# Patient Record
Sex: Male | Born: 1985 | Race: White | Hispanic: No | Marital: Single | Smoking: Current every day smoker
Health system: Southern US, Community
[De-identification: ages and names within clinical notes are randomized; demographics above are authoritative.]

---
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
## 2009-02-18 ENCOUNTER — Emergency Department (HOSPITAL_COMMUNITY): Admission: EM | Admit: 2009-02-18 | Discharge: 2009-02-18 | Payer: Self-pay | Admitting: Family Medicine

## 2009-06-08 ENCOUNTER — Emergency Department (HOSPITAL_COMMUNITY): Admission: EM | Admit: 2009-06-08 | Discharge: 2009-06-08 | Payer: Self-pay | Admitting: Emergency Medicine

## 2009-09-04 ENCOUNTER — Emergency Department (HOSPITAL_COMMUNITY): Admission: EM | Admit: 2009-09-04 | Discharge: 2009-09-04 | Payer: Self-pay | Admitting: Emergency Medicine

## 2009-09-21 ENCOUNTER — Emergency Department (HOSPITAL_COMMUNITY): Admission: EM | Admit: 2009-09-21 | Discharge: 2009-09-21 | Payer: Self-pay | Admitting: Family Medicine

## 2011-05-25 ENCOUNTER — Encounter (HOSPITAL_COMMUNITY): Payer: Self-pay | Admitting: Radiology

## 2011-05-25 ENCOUNTER — Inpatient Hospital Stay (HOSPITAL_COMMUNITY)
Admission: EM | Admit: 2011-05-25 | Discharge: 2011-05-26 | DRG: 074 | Disposition: A | Discharge status: Home / Self Care | Payer: 59 | Attending: General Surgery | Admitting: General Surgery

## 2011-05-25 ENCOUNTER — Emergency Department (HOSPITAL_COMMUNITY): Payer: 59

## 2011-05-25 DIAGNOSIS — S2249XA Multiple fractures of ribs, unspecified side, initial encounter for closed fracture: Secondary | ICD-10-CM

## 2011-05-25 DIAGNOSIS — IMO0002 Reserved for concepts with insufficient information to code with codable children: Secondary | ICD-10-CM | POA: Diagnosis present

## 2011-05-25 DIAGNOSIS — J9383 Other pneumothorax: Secondary | ICD-10-CM | POA: Diagnosis present

## 2011-05-25 DIAGNOSIS — F1910 Other psychoactive substance abuse, uncomplicated: Secondary | ICD-10-CM | POA: Diagnosis present

## 2011-05-25 DIAGNOSIS — G5690 Unspecified mononeuropathy of unspecified upper limb: Principal | ICD-10-CM | POA: Diagnosis present

## 2011-05-25 LAB — COMPREHENSIVE METABOLIC PANEL
ALT: 21 U/L (ref 0–53)
Albumin: 4.3 g/dL (ref 3.5–5.2)
CO2: 25 mEq/L (ref 19–32)
Creatinine, Ser: 0.75 mg/dL (ref 0.50–1.35)
GFR calc Af Amer: >60 mL/min (ref >60)
Potassium: 3.9 mEq/L (ref 3.5–5.1)
Sodium: 136 mEq/L (ref 135–145)
Total Protein: 7.1 g/dL (ref 6.0–8.3)

## 2011-05-25 LAB — DIFFERENTIAL
Basophils Absolute: 0 10*3/uL (ref 0.0–0.1)
Eosinophils Absolute: 0.1 10*3/uL (ref 0.0–0.7)
Neutro Abs: 14.1 10*3/uL — ABNORMAL HIGH (ref 1.7–7.7)
Neutrophils Relative %: 82 % — ABNORMAL HIGH (ref 43–77)

## 2011-05-25 LAB — CBC
Hemoglobin: 14.9 g/dL (ref 13.0–17.0)
MCH: 31.6 pg (ref 26.0–34.0)
MCHC: 36.1 g/dL — ABNORMAL HIGH (ref 30.0–36.0)
RBC: 4.72 MIL/uL (ref 4.22–5.81)
WBC: 17.1 10*3/uL — ABNORMAL HIGH (ref 4.0–10.5)

## 2011-05-25 LAB — URINALYSIS, ROUTINE W REFLEX MICROSCOPIC
Bilirubin Urine: NEGATIVE
Hgb urine dipstick: NEGATIVE
Leukocytes, UA: NEGATIVE
Nitrite: NEGATIVE
Protein, ur: NEGATIVE mg/dL

## 2011-05-25 MED ORDER — IOHEXOL 300 MG/ML  SOLN
80.0000 mL | Freq: Once | INTRAMUSCULAR | Status: AC | PRN
  Administered 2011-05-25: 80 mL via INTRAVENOUS

## 2011-05-26 ENCOUNTER — Inpatient Hospital Stay (HOSPITAL_COMMUNITY): Payer: 59
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
## 2011-06-01 ENCOUNTER — Other Ambulatory Visit (INDEPENDENT_AMBULATORY_CARE_PROVIDER_SITE_OTHER): Payer: Self-pay | Admitting: General Surgery

## 2011-06-01 MED ORDER — HYDROCODONE-ACETAMINOPHEN 10-325 MG PO TABS: 1.0000 | ORAL_TABLET | Freq: Four times a day (QID) | ORAL | Status: DC | PRN

## 2011-06-20 ENCOUNTER — Other Ambulatory Visit (INDEPENDENT_AMBULATORY_CARE_PROVIDER_SITE_OTHER): Payer: Self-pay | Admitting: Orthopedic Surgery

## 2011-06-20 MED ORDER — HYDROCODONE-ACETAMINOPHEN 10-325 MG PO TABS: 1.0000 | ORAL_TABLET | Freq: Four times a day (QID) | ORAL | Status: AC | PRN

## 2011-06-29 ENCOUNTER — Telehealth (INDEPENDENT_AMBULATORY_CARE_PROVIDER_SITE_OTHER): Payer: Self-pay | Admitting: Orthopedic Surgery
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
## 2013-05-12 ENCOUNTER — Emergency Department: Payer: Self-pay | Admitting: Emergency Medicine

## 2013-05-12 LAB — CBC WITH DIFFERENTIAL/PLATELET
Bands: 3 %
Metamyelocyte: 1 %
Platelet: 229 10*3/uL (ref 150–440)
RBC: 4.64 10*6/uL (ref 4.40–5.90)
Segmented Neutrophils: 39 %

## 2013-05-12 LAB — BASIC METABOLIC PANEL
Anion Gap: 9 (ref 7–16)
Creatinine: 0.97 mg/dL (ref 0.60–1.30)
Osmolality: 271 (ref 275–301)
Potassium: 3.4 mmol/L — ABNORMAL LOW (ref 3.5–5.1)

## 2014-06-27 ENCOUNTER — Encounter (HOSPITAL_COMMUNITY): Payer: Self-pay | Admitting: Emergency Medicine

## 2014-06-27 ENCOUNTER — Emergency Department (HOSPITAL_COMMUNITY)
Admission: EM | Admit: 2014-06-27 | Discharge: 2014-06-27 | Disposition: A | Discharge status: Home / Self Care | Payer: Self-pay | Source: Home / Self Care | Attending: Family Medicine | Admitting: Family Medicine

## 2014-06-27 DIAGNOSIS — H65191 Other acute nonsuppurative otitis media, right ear: Secondary | ICD-10-CM

## 2014-06-27 DIAGNOSIS — H65199 Other acute nonsuppurative otitis media, unspecified ear: Secondary | ICD-10-CM

## 2014-06-27 LAB — POCT URINALYSIS DIP (DEVICE)
Bilirubin Urine: NEGATIVE
Glucose, UA: NEGATIVE mg/dL
HGB URINE DIPSTICK: NEGATIVE
Ketones, ur: NEGATIVE mg/dL
LEUKOCYTES UA: NEGATIVE
NITRITE: NEGATIVE
Protein, ur: NEGATIVE mg/dL
Specific Gravity, Urine: 1.01 (ref 1.005–1.030)
Urobilinogen, UA: 0.2 mg/dL (ref 0.0–1.0)
pH: 6 (ref 5.0–8.0)

## 2014-06-27 MED ORDER — AMOXICILLIN 500 MG PO CAPS: 500.0000 mg | ORAL_CAPSULE | Freq: Three times a day (TID) | ORAL | Status: DC

## 2014-06-27 MED ORDER — CIPROFLOXACIN-HYDROCORTISONE 0.2-1 % OT SUSP: 4.0000 [drp] | Freq: Three times a day (TID) | OTIC | Status: DC

## 2014-06-27 NOTE — ED Notes (Signed)
Patient c/o right ear pain onset yesterday with dizziness. Patient reports he took a shower and felt funny afterwards. Also c/o of left testicular pain and rectal bleeding.  Patient is alert and oriented and in no acute distress.

## 2014-06-27 NOTE — Discharge Instructions (Signed)
Take all of medicine, see your doctor if further problems. °

## 2014-06-27 NOTE — ED Provider Notes (Addendum)
CSN: [id number]     Arrival date & time 06/27/14  0957 History   First MD Initiated Contact with Patient 06/27/14 1022     Chief Complaint  Patient presents with  . Otalgia  . Testicle Pain   (Consider location/radiation/quality/duration/timing/severity/associated sxs/prior Treatment) Patient is a 28 y.o. male presenting with ear pain. The history is provided by the patient.  Otalgia Location:  Right Behind ear:  No abnormality Quality:  Sore and throbbing Severity:  Mild Onset quality:  Gradual Duration:  2 days Progression:  Worsening Chronicity:  New Relieved by:  None tried Worsened by:  Nothing tried Ineffective treatments:  None tried Associated symptoms: congestion   Associated symptoms: no ear discharge, no fever, no rhinorrhea and no sore throat     History reviewed. No pertinent past medical history. History reviewed. No pertinent past surgical history. No family history on file. History  Substance Use Topics  . Smoking status: Current Every Day Smoker    Types: Cigarettes  . Smokeless tobacco: Not on file  . Alcohol Use: Yes    Review of Systems  Constitutional: Negative.  Negative for fever.  HENT: Positive for congestion and ear pain. Negative for ear discharge, postnasal drip, rhinorrhea and sore throat.   Eyes: Negative.     Allergies  Review of patient's allergies indicates no known allergies.  Home Medications   Prior to Admission medications   Medication Sig Start Date End Date Taking? Authorizing Provider  amoxicillin (AMOXIL) 500 MG capsule Take 1 capsule (500 mg total) by mouth 3 (three) times daily. 06/27/14   Linna HoffJames D Edy Mcbane, MD  ciprofloxacin-hydrocortisone ([facility]) otic suspension Place 4 drops into the right ear 3 (three) times daily. 06/27/14   Linna HoffJames D Deziree Mokry, MD   BP 127/71  Pulse 58  Temp(Src) 99.1 F (37.3 C) (Oral)  Resp 18  SpO2 100% Physical Exam  Nursing note and vitals reviewed. Constitutional: He is oriented to person, place,  and time. He appears well-developed and well-nourished. No distress.  HENT:  Head: Normocephalic.  Right Ear: Tympanic membrane is injected, erythematous and retracted. Tympanic membrane mobility is abnormal. There is hemotympanum.  Left Ear: Tympanic membrane, external ear and ear canal normal.  Nose: Nose normal.  Mouth/Throat: Oropharynx is clear and moist.  Eyes: Pupils are equal, round, and reactive to light.  Neck: Normal range of motion. Neck supple.  Lymphadenopathy:    He has no cervical adenopathy.  Neurological: He is alert and oriented to person, place, and time.  Skin: Skin is warm and dry.    ED Course  Procedures (including critical care time) Labs Review Labs Reviewed  POCT URINALYSIS DIP (DEVICE)    Imaging Review No results found.   MDM   1. Acute nonsuppurative otitis media of right ear        Linna HoffJames D Solstice Lastinger, MD 06/27/14 1042  Linna HoffJames D Areej Tayler, MD 06/27/14 1235

## 2016-07-22 ENCOUNTER — Other Ambulatory Visit: Payer: Self-pay | Admitting: Physician Assistant

## 2016-07-22 ENCOUNTER — Ambulatory Visit
Admission: RE | Admit: 2016-07-22 | Discharge: 2016-07-22 | Disposition: A | Discharge status: Home / Self Care | Payer: No Typology Code available for payment source | Source: Ambulatory Visit | Attending: Physician Assistant | Admitting: Physician Assistant

## 2016-07-22 DIAGNOSIS — R52 Pain, unspecified: Secondary | ICD-10-CM

## 2017-11-11 IMAGING — CR DG SHOULDER 2+V*R*
3 series · 3 of 3 positions shown · non-contrast
Comparison: Right shoulder series of May 25, 2011

CLINICAL DATA: Right shoulder pain for the past 2 months made worse
with lifting or other physical activity. No known injury.

EXAM:
RIGHT SHOULDER - 2+ VIEW

[w shoulder ap external righ]
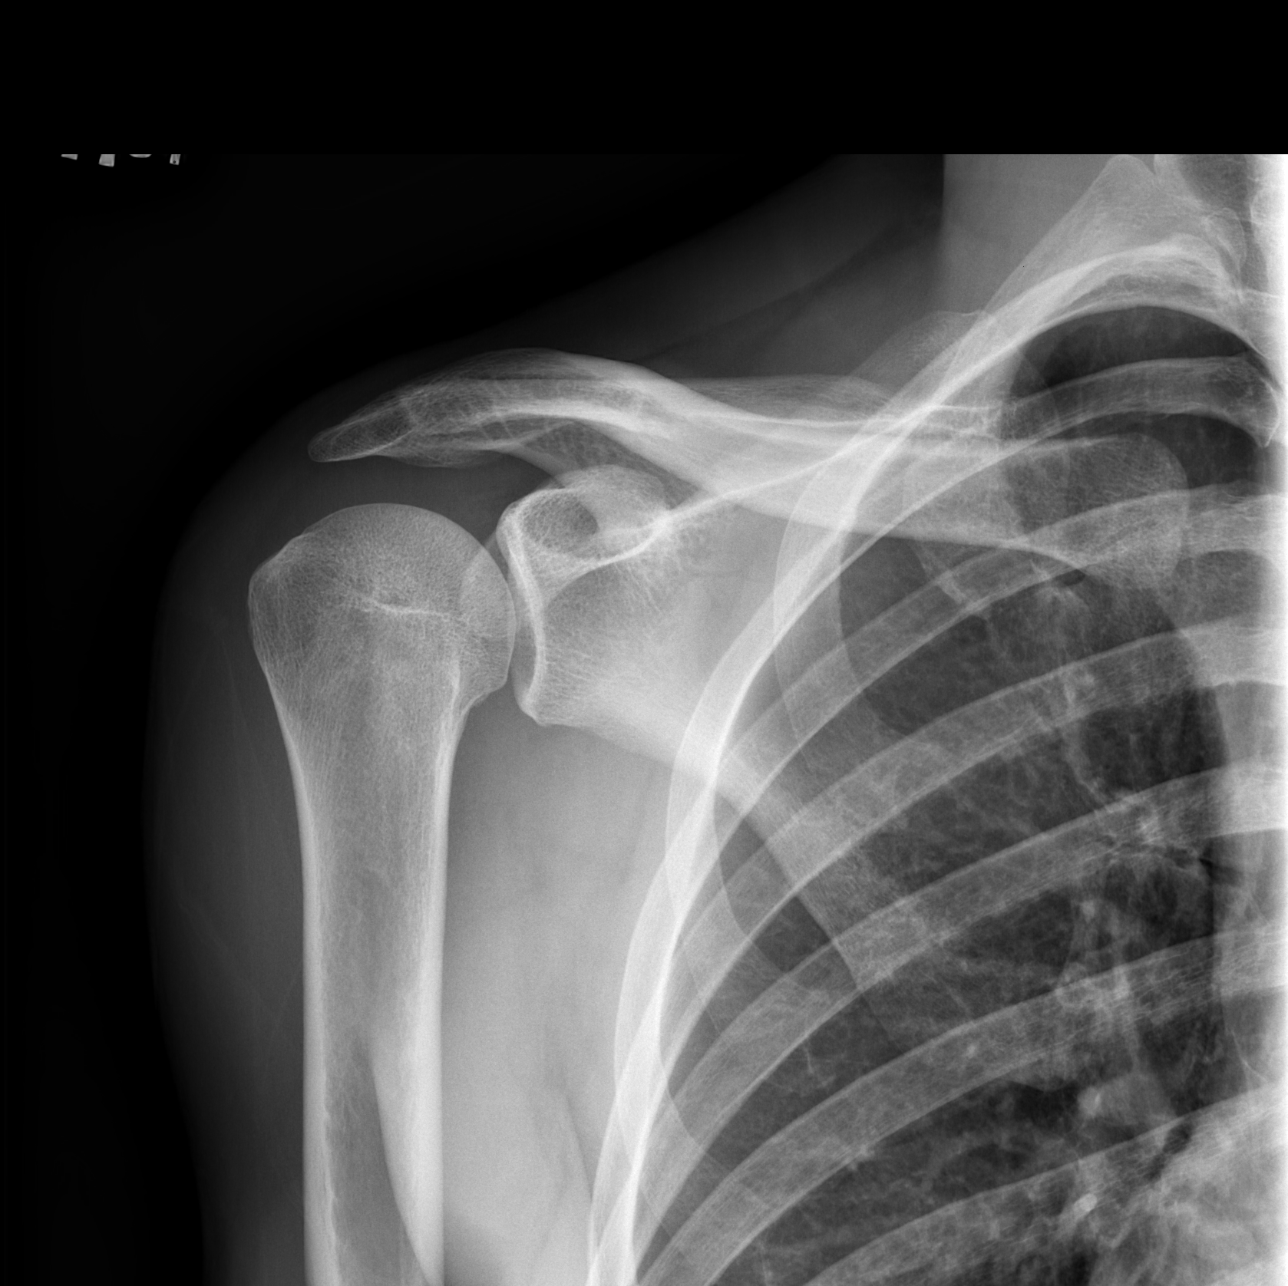

[w shoulder y view right]
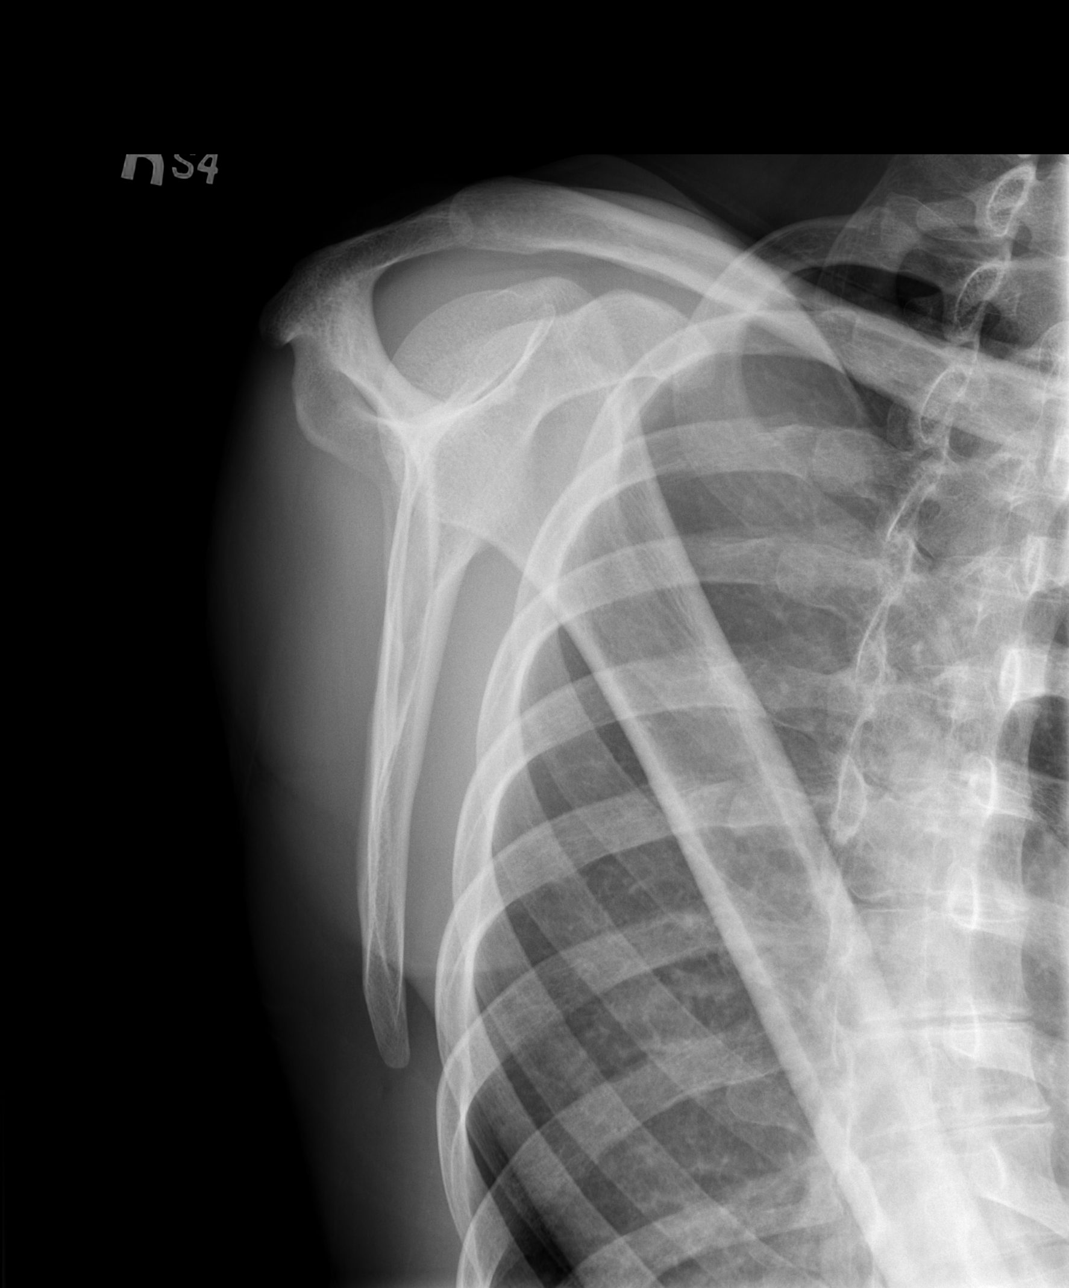

[x shoulder axillary right]
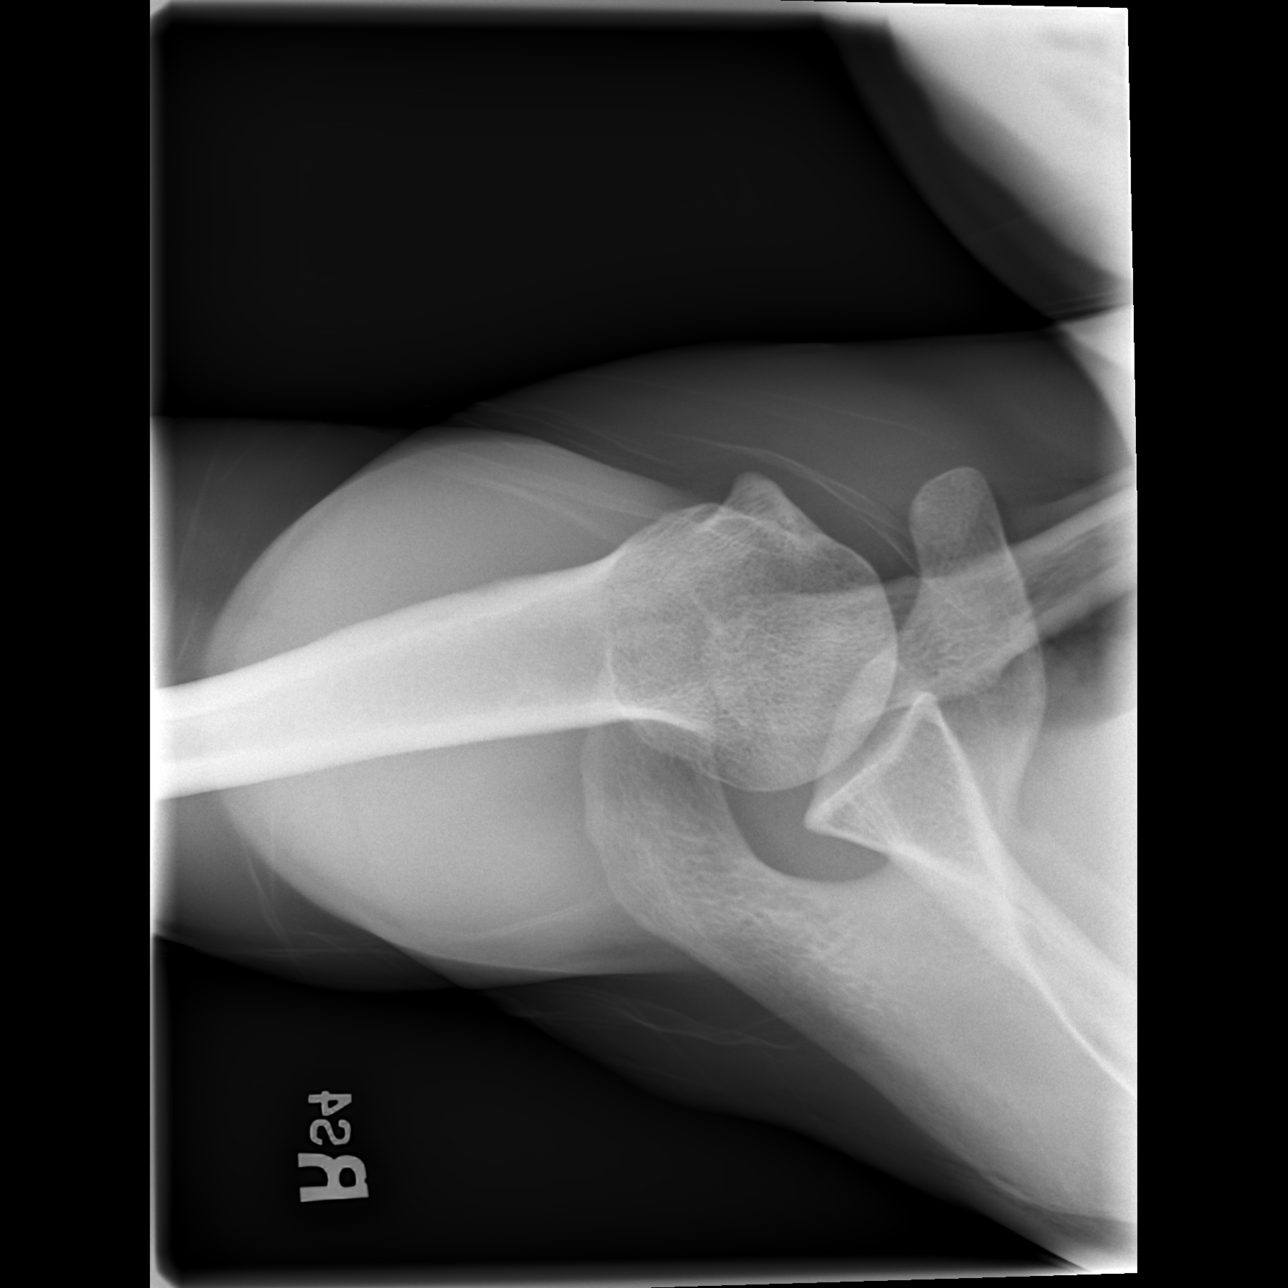

[3 of 3 positions shown; findings below may reference images not displayed]

FINDINGS: The bones are subjectively adequately mineralized. There is no acute
or healing fracture. The joint spaces are preserved. There are no
abnormal soft tissue calcifications. The observed portions of the
right clavicle and upper right ribs are normal.
IMPRESSION: There is no acute or significant chronic bony abnormality of the
right shoulder.

## 2018-05-30 ENCOUNTER — Other Ambulatory Visit: Payer: Self-pay

## 2018-05-30 ENCOUNTER — Encounter: Payer: Self-pay | Admitting: Emergency Medicine

## 2018-05-30 DIAGNOSIS — K529 Noninfective gastroenteritis and colitis, unspecified: Secondary | ICD-10-CM | POA: Diagnosis not present

## 2018-05-30 DIAGNOSIS — F17210 Nicotine dependence, cigarettes, uncomplicated: Secondary | ICD-10-CM | POA: Diagnosis not present

## 2018-05-30 DIAGNOSIS — R109 Unspecified abdominal pain: Secondary | ICD-10-CM | POA: Diagnosis present

## 2018-05-30 LAB — URINALYSIS, COMPLETE (UACMP) WITH MICROSCOPIC
Bacteria, UA: NONE SEEN
Bilirubin Urine: NEGATIVE
Glucose, UA: NEGATIVE mg/dL
Hgb urine dipstick: NEGATIVE
Ketones, ur: NEGATIVE mg/dL
Leukocytes, UA: NEGATIVE
Nitrite: NEGATIVE
PROTEIN: NEGATIVE mg/dL
SQUAMOUS EPITHELIAL / LPF: NONE SEEN (ref 0–5)
Specific Gravity, Urine: 1.02 (ref 1.005–1.030)
WBC, UA: NONE SEEN WBC/hpf (ref 0–5)
pH: 7 (ref 5.0–8.0)

## 2018-05-30 LAB — COMPREHENSIVE METABOLIC PANEL
ALBUMIN: 4.2 g/dL (ref 3.5–5.0)
ALT: 18 U/L (ref 0–44)
AST: 33 U/L (ref 15–41)
Alkaline Phosphatase: 58 U/L (ref 38–126)
Anion gap: 9 (ref 5–15)
BUN: 14 mg/dL (ref 6–20)
CHLORIDE: 105 mmol/L (ref 98–111)
CO2: 27 mmol/L (ref 22–32)
Calcium: 9.1 mg/dL (ref 8.9–10.3)
Creatinine, Ser: 0.95 mg/dL (ref 0.61–1.24)
GFR calc Af Amer: >60 mL/min (ref >60)
GFR calc non Af Amer: >60 mL/min (ref >60)
GLUCOSE: 122 mg/dL — AB (ref 70–99)
POTASSIUM: 3.8 mmol/L (ref 3.5–5.1)
Sodium: 141 mmol/L (ref 135–145)
Total Bilirubin: 0.5 mg/dL (ref 0.3–1.2)
Total Protein: 7.1 g/dL (ref 6.5–8.1)

## 2018-05-30 LAB — CBC
HEMATOCRIT: 42.9 % (ref 40.0–52.0)
Hemoglobin: 14.5 g/dL (ref 13.0–18.0)
MCH: 30.5 pg (ref 26.0–34.0)
MCHC: 33.9 g/dL (ref 32.0–36.0)
MCV: 89.9 fL (ref 80.0–100.0)
Platelets: 221 10*3/uL (ref 150–440)
RBC: 4.77 MIL/uL (ref 4.40–5.90)
RDW: 13.4 % (ref 11.5–14.5)
WBC: 12.8 10*3/uL — ABNORMAL HIGH (ref 3.8–10.6)

## 2018-05-30 LAB — LIPASE, BLOOD: LIPASE: 34 U/L (ref 11–51)

## 2018-05-30 NOTE — ED Triage Notes (Signed)
Patient ambulatory to triage with steady gait, without difficulty or distress noted; pt reports today having abd pain with N/V/D; st feeling better at present

## 2018-05-31 ENCOUNTER — Emergency Department
Admission: EM | Admit: 2018-05-31 | Discharge: 2018-05-31 | Disposition: A | Discharge status: Home / Self Care | Payer: 59 | Attending: Emergency Medicine | Admitting: Emergency Medicine

## 2018-05-31 DIAGNOSIS — K529 Noninfective gastroenteritis and colitis, unspecified: Secondary | ICD-10-CM

## 2018-05-31 MED ORDER — LOPERAMIDE HCL 2 MG PO CAPS
4.0000 mg | ORAL_CAPSULE | Freq: Once | ORAL | Status: AC
  Administered 2018-05-31: 4 mg via ORAL
  Filled 2018-05-31: qty 2

## 2018-05-31 MED ORDER — SODIUM CHLORIDE 0.9 % IV BOLUS
1000.0000 mL | Freq: Once | INTRAVENOUS | Status: AC
  Administered 2018-05-31: 1000 mL via INTRAVENOUS

## 2018-05-31 NOTE — ED Provider Notes (Signed)
[facility] Emergency Department Provider Note   First MD Initiated Contact with Patient 05/31/18 0111     (approximate)  I have reviewed the triage vital signs and the nursing notes.   HISTORY  Chief Complaint Abdominal Pain   HPI Eddie Gonzales is a 32 y.o. male presents to the emergency department with history of abrupt onset of nausea vomiting diarrhea and abdominal cramps after eating [employer] at 6 PM.  Patient states that he has had multiple episodes of nonbloody emesis and diarrhea which has stopped now a few hours".  Patient denies any fever.   Past medical history None There are no active problems to display for this patient.  Past surgical history None  Prior to Admission medications   Medication Sig Start Date End Date Taking? Authorizing Provider  amoxicillin (AMOXIL) 500 MG capsule Take 1 capsule (500 mg total) by mouth 3 (three) times daily. 06/27/14   Linna Hoff, MD  ciprofloxacin-hydrocortisone ([facility]) otic suspension Place 4 drops into the right ear 3 (three) times daily. 06/27/14   Linna Hoff, MD    Allergies No known drug allergies No family history on file.  Social History Social History   Tobacco Use  . Smoking status: Current Every Day Smoker    Types: Cigarettes  . Smokeless tobacco: Never Used  Substance Use Topics  . Alcohol use: Yes  . Drug use: No    Review of Systems Constitutional: No fever/chills Eyes: No visual changes. ENT: No sore throat. Cardiovascular: Denies chest pain. Respiratory: Denies shortness of breath. Gastrointestinal: Positive for abdominal cramping nausea vomiting and diarrhea Genitourinary: Negative for dysuria. Musculoskeletal: Negative for neck pain.  Negative for back pain. Integumentary: Negative for rash. Neurological: Negative for headaches, focal weakness or numbness.   ____________________________________________   PHYSICAL EXAM:  VITAL SIGNS: ED Triage Vitals  Enc  Vitals Group     BP 05/30/18 2321 127/67     Pulse Rate 05/30/18 2321 90     Resp 05/30/18 2321 18     Temp 05/30/18 2321 97.8 F (36.6 C)     Temp Source 05/30/18 2321 Oral     SpO2 05/30/18 2321 99 %     Weight 05/30/18 2321 59 kg (130 lb)     Height 05/30/18 2321 1.727 m (5\' 8" )     Head Circumference --      Peak Flow --      Pain Score 05/30/18 2320 4     Pain Loc --      Pain Edu? --      Excl. in GC? --     Constitutional: Alert and oriented. Well appearing and in no acute distress. Eyes: Conjunctivae are normal.  Head: Atraumatic. Mouth/Throat: Mucous membranes are moist. Oropharynx non-erythematous. Neck: No stridor.   Cardiovascular: Normal rate, regular rhythm. Good peripheral circulation. Grossly normal heart sounds. Respiratory: Normal respiratory effort.  No retractions. Lungs CTAB. Gastrointestinal: Soft and nontender. No distention.  Musculoskeletal: No lower extremity tenderness nor edema. No gross deformities of extremities. Neurologic:  Normal speech and language. No gross focal neurologic deficits are appreciated.  Skin:  Skin is warm, dry and intact. No rash noted. Psychiatric: Mood and affect are normal. Speech and behavior are normal.  ____________________________________________   LABS (all labs ordered are listed, but only abnormal results are displayed)  Labs Reviewed  COMPREHENSIVE METABOLIC PANEL - Abnormal; Notable for the following components:      Result Value   Glucose, Bld  122 (*)    All other components within normal limits  CBC - Abnormal; Notable for the following components:   WBC 12.8 (*)    All other components within normal limits  URINALYSIS, COMPLETE (UACMP) WITH MICROSCOPIC - Abnormal; Notable for the following components:   Color, Urine YELLOW (*)    APPearance CLOUDY (*)    All other components within normal limits  LIPASE, BLOOD    Procedures   ____________________________________________   INITIAL IMPRESSION /  ASSESSMENT AND PLAN / ED COURSE  As part of my medical decision making, I reviewed the following data within the electronic MEDICAL RECORD NUMBER    32 year old male presenting with history and physical exam consistent with acute gastroenteritis.  Patient received 2 L IV normal saline no further nausea vomiting or diarrhea while in the emergency department. ____________________________________________  FINAL CLINICAL IMPRESSION(S) / ED DIAGNOSES  Final diagnoses:  Gastroenteritis     MEDICATIONS GIVEN DURING THIS VISIT:  Medications  sodium chloride 0.9 % bolus 1,000 mL (has no administration in time range)  loperamide (IMODIUM) capsule 4 mg (has no administration in time range)  sodium chloride 0.9 % bolus 1,000 mL (has no administration in time range)     ED Discharge Orders    None       Note:  This document was prepared using Dragon voice recognition software and may include unintentional dictation errors.    Darci CurrentBrown, [place] N, MD 05/31/18 [phone]

## 2021-08-16 ENCOUNTER — Other Ambulatory Visit: Payer: Self-pay

## 2021-08-16 ENCOUNTER — Ambulatory Visit
Admission: RE | Admit: 2021-08-16 | Discharge: 2021-08-16 | Disposition: A | Discharge status: Home / Self Care | Payer: 59 | Source: Ambulatory Visit | Attending: Internal Medicine | Admitting: Internal Medicine

## 2021-08-16 VITALS — BP 126/76 | HR 91 | Temp 98.6°F | Resp 20 | Ht 68.0 in | Wt 135.0 lb

## 2021-08-16 DIAGNOSIS — R112 Nausea with vomiting, unspecified: Secondary | ICD-10-CM

## 2021-08-16 DIAGNOSIS — A059 Bacterial foodborne intoxication, unspecified: Secondary | ICD-10-CM

## 2021-08-16 MED ORDER — ONDANSETRON 4 MG PO TBDP: 4.0000 mg | ORAL_TABLET | Freq: Three times a day (TID) | ORAL | 0 refills | Status: DC | PRN

## 2021-08-16 NOTE — ED Provider Notes (Signed)
[facility]    CSN: [id number] Arrival date & time: 08/16/21  1854      History   Chief Complaint Chief Complaint  Patient presents with   Nausea   Diarrhea    HPI Eddie Gonzales is a 35 y.o. male who presents with onset of vomiting and diarrhea since this am. Thinks if from some questionable cheese he ate last night at his girlfriends's home on tacos that were made. His girl friend ate the same thing except no cheese and she is fine. Had 4 episodes of diarrhea and 3 of vomiting. Has been able to eat fine this pm. Only has mild nausea.  He is feeling much better now. Here because he needs a work note for missing work this am.     History reviewed. No pertinent past medical history.  There are no problems to display for this patient.   History reviewed. No pertinent surgical history.     Home Medications    Prior to Admission medications   Medication Sig Start Date End Date Taking? Authorizing Provider  ondansetron (ZOFRAN-ODT) 4 MG disintegrating tablet Take 1 tablet (4 mg total) by mouth every 8 (eight) hours as needed for nausea or vomiting. 08/16/21  Yes Rodriguez-Southworth, Nettie Elm, PA-C    Family History No family history on file.  Social History Social History   Tobacco Use   Smoking status: Every Day    Types: Cigarettes   Smokeless tobacco: Never  Vaping Use   Vaping Use: Every day   Substances: Nicotine  Substance Use Topics   Alcohol use: Yes    Comment: Soically   Drug use: No     Allergies   Patient has no known allergies.   Review of Systems Review of Systems  Constitutional:  Negative for chills, diaphoresis and fever.  Gastrointestinal:  Positive for diarrhea, nausea and vomiting. Negative for abdominal pain.  Skin:  Negative for rash.    Physical Exam Triage Vital Signs ED Triage Vitals  Enc Vitals Group     BP 08/16/21 1909 126/76     Pulse Rate 08/16/21 1909 91     Resp 08/16/21 1909 20     Temp 08/16/21 1909  98.6 F (37 C)     Temp Source 08/16/21 1909 Oral     SpO2 08/16/21 1909 98 %     Weight 08/16/21 1910 135 lb (61.2 kg)     Height 08/16/21 1910 5\' 8"  (1.727 m)     Head Circumference --      Peak Flow --      Pain Score 08/16/21 1910 0     Pain Loc --      Pain Edu? --      Excl. in GC? --    No data found.  Updated Vital Signs BP 126/76 (BP Location: Right Arm)   Pulse 91   Temp 98.6 F (37 C) (Oral)   Resp 20   Ht 5\' 8"  (1.727 m)   Wt 135 lb (61.2 kg)   SpO2 98%   BMI 20.53 kg/m   Visual Acuity Right Eye Distance:   Left Eye Distance:   Bilateral Distance:    Right Eye Near:   Left Eye Near:    Bilateral Near:     Physical Exam Constitutional:      General: He is not in acute distress.    Appearance: Normal appearance. He is not toxic-appearing.  HENT:     Head: Normocephalic.  Right Ear: External ear normal.     Left Ear: External ear normal.  Eyes:     General: No scleral icterus.    Conjunctiva/sclera: Conjunctivae normal.  Pulmonary:     Effort: Pulmonary effort is normal.  Abdominal:     General: Bowel sounds are normal.     Palpations: Abdomen is soft. There is no mass.     Tenderness: There is no abdominal tenderness. There is no guarding or rebound.  Musculoskeletal:        General: Normal range of motion.     Cervical back: Neck supple.  Skin:    General: Skin is warm and dry.     Findings: No rash.  Neurological:     Mental Status: He is alert and oriented to person, place, and time.     Gait: Gait normal.  Psychiatric:        Mood and Affect: Mood normal.        Behavior: Behavior normal.        Thought Content: Thought content normal.        Judgment: Judgment normal.     UC Treatments / Results  Labs (all labs ordered are listed, but only abnormal results are displayed) Labs Reviewed - No data to display  EKG   Radiology No results found.  Procedures Procedures (including critical care time)  Medications Ordered in  UC Medications - No data to display  Initial Impression / Assessment and Plan / UC Course  I have reviewed the triage vital signs and the nursing notes. Food poisoning improving. See instructions.      Final Clinical Impressions(s) / UC Diagnoses   Final diagnoses:  Food poisoning  Nausea vomiting and diarrhea     Discharge Instructions      If the diarrhea comes back use Pepto and not imodium      ED Prescriptions     Medication Sig Dispense Auth. Provider   ondansetron (ZOFRAN-ODT) 4 MG disintegrating tablet Take 1 tablet (4 mg total) by mouth every 8 (eight) hours as needed for nausea or vomiting. 20 tablet Rodriguez-Southworth, Nettie Elm, PA-C      PDMP not reviewed this encounter.   Garey Ham, Cordelia Poche 08/16/21 1935

## 2021-08-16 NOTE — ED Triage Notes (Signed)
Pt reports thinks he has food poisoning, ate some cheese last night "that was questionable. This morning had emesis and diarrhea. Reports several episodes of emesis earlier today, feeling "much better now".   ETOH yesterday as well.   Pt reports "I'm basically here to for a work note I half to be seen and treated before I can go back".   Home COIVD test negative this morning.

## 2021-08-16 NOTE — Discharge Instructions (Signed)
If the diarrhea comes back use Pepto and not imodium

## 2021-08-25 ENCOUNTER — Emergency Department (HOSPITAL_COMMUNITY): Payer: 59

## 2021-08-25 ENCOUNTER — Encounter (HOSPITAL_COMMUNITY): Payer: Self-pay | Admitting: *Deleted

## 2021-08-25 ENCOUNTER — Other Ambulatory Visit: Payer: Self-pay

## 2021-08-25 ENCOUNTER — Emergency Department (HOSPITAL_COMMUNITY)
Admission: EM | Admit: 2021-08-25 | Discharge: 2021-08-25 | Disposition: A | Discharge status: Home / Self Care | Payer: 59 | Attending: Emergency Medicine | Admitting: Emergency Medicine

## 2021-08-25 DIAGNOSIS — W228XXA Striking against or struck by other objects, initial encounter: Secondary | ICD-10-CM | POA: Diagnosis not present

## 2021-08-25 DIAGNOSIS — F17210 Nicotine dependence, cigarettes, uncomplicated: Secondary | ICD-10-CM | POA: Diagnosis not present

## 2021-08-25 DIAGNOSIS — Z23 Encounter for immunization: Secondary | ICD-10-CM | POA: Diagnosis not present

## 2021-08-25 DIAGNOSIS — M79641 Pain in right hand: Secondary | ICD-10-CM | POA: Diagnosis not present

## 2021-08-25 DIAGNOSIS — S61411A Laceration without foreign body of right hand, initial encounter: Secondary | ICD-10-CM | POA: Insufficient documentation

## 2021-08-25 DIAGNOSIS — S6991XA Unspecified injury of right wrist, hand and finger(s), initial encounter: Secondary | ICD-10-CM | POA: Diagnosis present

## 2021-08-25 MED ORDER — OXYCODONE-ACETAMINOPHEN 5-325 MG PO TABS
1.0000 | ORAL_TABLET | Freq: Once | ORAL | Status: AC
  Administered 2021-08-25: 1 via ORAL
  Filled 2021-08-25: qty 1

## 2021-08-25 MED ORDER — NAPROXEN 500 MG PO TABS: 500.0000 mg | ORAL_TABLET | Freq: Two times a day (BID) | ORAL | 0 refills | Status: DC

## 2021-08-25 MED ORDER — TETANUS-DIPHTH-ACELL PERTUSSIS 5-2.5-18.5 LF-MCG/0.5 IM SUSY
0.5000 mL | PREFILLED_SYRINGE | Freq: Once | INTRAMUSCULAR | Status: AC
  Administered 2021-08-25: 0.5 mL via INTRAMUSCULAR
  Filled 2021-08-25: qty 0.5

## 2021-08-25 MED ORDER — NAPROXEN 250 MG PO TABS
500.0000 mg | ORAL_TABLET | Freq: Once | ORAL | Status: AC
  Administered 2021-08-25: 500 mg via ORAL
  Filled 2021-08-25: qty 2

## 2021-08-25 MED ORDER — NAPROXEN 500 MG PO TABS: 500.0000 mg | ORAL_TABLET | Freq: Two times a day (BID) | ORAL | 0 refills | Status: AC

## 2021-08-25 NOTE — Discharge Instructions (Addendum)
Your xray today did not show any fracture.  However, we will treat this like a fracture in a conservative measurement with a splint along with anti-inflammatories.  The prescription for the anti-inflammatory she is to be taking 1 tablet twice a day for the next 7 days.  The number to our hand specialist attached to your chart, should you need follow-up your symptoms do not improve.  You may also apply ice, elevate in order to prevent further swelling.

## 2021-08-25 NOTE — ED Notes (Signed)
RN reviewed discharge instructions w/ pt. Follow up and prescriptions reviewed, pt had no further questions °

## 2021-08-25 NOTE — ED Provider Notes (Signed)
Emergency Medicine Provider Triage Evaluation Note  Eddie Gonzales , a 35 y.o. male  was evaluated in triage.  Pt complains of right hand injury.  Patient reports accidentally burning himself on the palm of his right hand yesterday evening.  Then punched a wall approximately 0 400 this morning.  Patient has swelling and pain to right hand since then.  Denies any numbness or weakness.  Patient unsure when his last tetanus shot was.  Patient is right-hand dominant.  Review of Systems  Positive: Right hand swelling and pain Negative: Numbness, weakness  Physical Exam  BP 136/87 (BP Location: Right Arm)   Pulse (!) 103   Temp 97.6 F (36.4 C) (Oral)   Resp 18   SpO2 98%  Gen:   Awake, no distress   Resp:  Normal effort  MSK:   Moves extremities without difficulty.  Patient has diffuse swelling and tenderness to dorsum of right hand.  Decreased range of motion to 2nd-5th digits of right hand secondary to complaints of pain.  Sensation and cap refill less than 2 seconds in all digits of right hand. Other:  +2 right radial pulse  Medical Decision Making  Medically screening exam initiated at 9:52 AM.  Appropriate orders placed.  Eddie Gonzales was informed that the remainder of the evaluation will be completed by another provider, this initial triage assessment does not replace that evaluation, and the importance of remaining in the ED until their evaluation is complete.  The patient appears stable so that the remainder of the work up may be completed by another provider.      Eddie Schroeder, PA-C 08/25/21 1017    Eddie Core, MD 08/25/21 [phone]

## 2021-08-25 NOTE — ED Notes (Signed)
Called ortho for splint 

## 2021-08-25 NOTE — ED Provider Notes (Signed)
[facility] EMERGENCY DEPARTMENT Provider Note   CSN: [id number] Arrival date & time: 08/25/21  3149     History Chief Complaint  Patient presents with   Hand Injury    Eddie Gonzales is a 35 y.o. male.  35 y.o male with no PMH presents to the ED with a chief complaint of right hand pain status post trauma.  Patient reports he was angry, therefore he punched a wall around 3 AM this morning, less than 12 hours since arrival to the ED.  He has not taken any medication for improvement in symptoms.  He reports swelling, pain to the area but no numbness or tingling.  His last tetanus immunization is unknown.  Denies any other injury.  The history is provided by the patient and a parent.  Hand Injury Associated symptoms: no fever       History reviewed. No pertinent past medical history.  There are no problems to display for this patient.   History reviewed. No pertinent surgical history.     History reviewed. No pertinent family history.  Social History   Tobacco Use   Smoking status: Every Day    Types: Cigarettes   Smokeless tobacco: Never  Vaping Use   Vaping Use: Every day   Substances: Nicotine  Substance Use Topics   Alcohol use: Yes    Comment: Soically   Drug use: No    Home Medications Prior to Admission medications   Medication Sig Start Date End Date Taking? Authorizing Provider  naproxen (NAPROSYN) 500 MG tablet Take 1 tablet (500 mg total) by mouth 2 (two) times daily for 7 days. 08/25/21 09/01/21 Yes Jedi Catalfamo, PA-C  ondansetron (ZOFRAN-ODT) 4 MG disintegrating tablet Take 1 tablet (4 mg total) by mouth every 8 (eight) hours as needed for nausea or vomiting. 08/16/21   Rodriguez-Southworth, Nettie Elm, PA-C    Allergies    Patient has no known allergies.  Review of Systems   Review of Systems  Constitutional:  Negative for chills and fever.  Musculoskeletal:  Positive for arthralgias.  Skin:  Positive for wound.   Physical  Exam Updated Vital Signs BP 136/87 (BP Location: Right Arm)   Pulse (!) 103   Temp 97.6 F (36.4 C) (Oral)   Resp 18   SpO2 98%   Physical Exam Vitals and nursing note reviewed.  Constitutional:      Appearance: Normal appearance.  HENT:     Head: Normocephalic and atraumatic.  Cardiovascular:     Rate and Rhythm: Normal rate.  Pulmonary:     Effort: Pulmonary effort is normal.  Musculoskeletal:        General: Signs of injury present.     Right hand: Swelling, laceration and bony tenderness present. No deformity. Decreased strength of thumb/finger opposition. Normal strength of finger abduction and wrist extension. Normal sensation. There is no disruption of two-point discrimination. Normal capillary refill. Normal pulse.     Cervical back: Normal range of motion and neck supple.     Comments: Significant swelling noted to the right dorsum aspect of the hand. TTP along dorsum aspect. With worsen pain along the base of the small and ring left finger. No palpable deformity. Pulses present, capillary refill is intact.   Skin:    General: Skin is warm and dry.  Neurological:     Mental Status: He is alert and oriented to person, place, and time.    ED Results / Procedures / Treatments   Labs (all  labs ordered are listed, but only abnormal results are displayed) Labs Reviewed - No data to display  EKG None  Radiology DG Hand Complete Right  Result Date: 08/25/2021 CLINICAL DATA:  Pain and swelling after punching a wall EXAM: RIGHT HAND - COMPLETE 3+ VIEW COMPARISON:  Wrist radiographs 02/18/2009 FINDINGS: There is no acute fracture or dislocation. There is a remote appearing ulnar styloid fracture. Alignment is normal. The joint spaces are preserved. There is no erosive change. There is soft tissue swelling over the dorsum of the hand. IMPRESSION: 1. No acute fracture or dislocation. 2. Soft tissue swelling over the dorsum of the hand. Electronically Signed   By: Lesia Hausen  M.D.   On: 08/25/2021 10:37    Procedures Procedures   Medications Ordered in ED Medications  Tdap (BOOSTRIX) injection 0.5 mL (has no administration in time range)  naproxen (NAPROSYN) tablet 500 mg (has no administration in time range)  oxyCODONE-acetaminophen (PERCOCET/ROXICET) 5-325 MG per tablet 1 tablet (1 tablet Oral Given 08/25/21 0955)    ED Course  I have reviewed the triage vital signs and the nursing notes.  Pertinent labs & imaging results that were available during my care of the patient were reviewed by me and considered in my medical decision making (see chart for details).    MDM Rules/Calculators/A&P   Presents to the ED with right hand pain status posttrauma less than 12 hours since injury.  Reports he punched a wall.  Pain along the dorsum aspect of the hand, with a slight abrasion noted.  Last tetanus immunization is unknown, will provide with update on today's visit.  Severe swelling noted to the dorsum aspect of the right hand, worsened pain seems to be at the base of the right small and ring finger, some suspicion for occult fracture.  As are present, capillary refill is intact, there is difficulty with thumb adduction and, due to swelling, decreased strength in hand due to pain.  Addition, patient does have a burn to the aspect of his hand.  X-ray of the right hand shows swelling to the dorsum aspect but no visible fracture at this time.  We did discuss providing him with anti-inflammatories, RICE therapy in order to follow-up with hand specialist.  We will treat likely sprain of his right hand like a fracture were conservative therapy along with ulnar gutter splint to help with swelling and discomfort.  He is to have follow-up with hand specialist if pain does not improve.  He understands and agrees to management, return precautions gust at length.  Patient stable for discharge.    Portions of this note were generated with Scientist, clinical (histocompatibility and immunogenetics). Dictation  errors may occur despite best attempts at proofreading.  Final Clinical Impression(s) / ED Diagnoses Final diagnoses:  Pain of right hand    Rx / DC Orders ED Discharge Orders          Ordered    naproxen (NAPROSYN) 500 MG tablet  2 times daily        08/25/21 1121             Claude Manges, PA-C 08/25/21 1128    Terrilee Files, MD 08/25/21 1715

## 2021-08-25 NOTE — ED Triage Notes (Signed)
Pt reports punching a wall around 4am and now has swelling/pain to his hand.

## 2021-08-25 NOTE — Progress Notes (Signed)
Orthopedic Tech Progress Note Patient Details:  Eddie Gonzales 1986/11/11 [id number]  Ortho Devices Type of Ortho Device: Ulna gutter splint Ortho Device/Splint Location: RUE Ortho Device/Splint Interventions: Ordered, Application   Post Interventions Patient Tolerated: Well Instructions Provided: Care of device  [name] 08/25/2021, 12:07 PM

## 2022-06-05 ENCOUNTER — Emergency Department (HOSPITAL_COMMUNITY)
Admission: EM | Admit: 2022-06-05 | Discharge: 2022-06-05 | Disposition: A | Discharge status: Home / Self Care | Payer: 59 | Attending: Emergency Medicine | Admitting: Emergency Medicine

## 2022-06-05 ENCOUNTER — Encounter (HOSPITAL_COMMUNITY): Payer: Self-pay

## 2022-06-05 ENCOUNTER — Emergency Department (HOSPITAL_COMMUNITY): Payer: 59

## 2022-06-05 DIAGNOSIS — R1084 Generalized abdominal pain: Secondary | ICD-10-CM

## 2022-06-05 DIAGNOSIS — R1011 Right upper quadrant pain: Secondary | ICD-10-CM | POA: Diagnosis present

## 2022-06-05 DIAGNOSIS — K529 Noninfective gastroenteritis and colitis, unspecified: Secondary | ICD-10-CM | POA: Diagnosis not present

## 2022-06-05 DIAGNOSIS — R7401 Elevation of levels of liver transaminase levels: Secondary | ICD-10-CM | POA: Insufficient documentation

## 2022-06-05 LAB — CBC WITH DIFFERENTIAL/PLATELET
Abs Immature Granulocytes: 0.02 10*3/uL (ref 0.00–0.07)
Basophils Absolute: 0 10*3/uL (ref 0.0–0.1)
Basophils Relative: 0 %
Eosinophils Absolute: 0.2 10*3/uL (ref 0.0–0.5)
Eosinophils Relative: 2 %
HCT: 43.8 % (ref 39.0–52.0)
Hemoglobin: 14.9 g/dL (ref 13.0–17.0)
Immature Granulocytes: 0 %
Lymphocytes Relative: 19 %
Lymphs Abs: 1.3 10*3/uL (ref 0.7–4.0)
MCH: 30.8 pg (ref 26.0–34.0)
MCHC: 34 g/dL (ref 30.0–36.0)
MCV: 90.7 fL (ref 80.0–100.0)
Monocytes Absolute: 1.3 10*3/uL — ABNORMAL HIGH (ref 0.1–1.0)
Monocytes Relative: 19 %
Neutro Abs: 4.1 10*3/uL (ref 1.7–7.7)
Neutrophils Relative %: 60 %
Platelets: 249 10*3/uL (ref 150–400)
RBC: 4.83 MIL/uL (ref 4.22–5.81)
RDW: 12.7 % (ref 11.5–15.5)
WBC: 7 10*3/uL (ref 4.0–10.5)
nRBC: 0 % (ref 0.0–0.2)

## 2022-06-05 LAB — COMPREHENSIVE METABOLIC PANEL
ALT: 26 U/L (ref 0–44)
AST: 60 U/L — ABNORMAL HIGH (ref 15–41)
Albumin: 4 g/dL (ref 3.5–5.0)
Alkaline Phosphatase: 64 U/L (ref 38–126)
Anion gap: 15 (ref 5–15)
BUN: 8 mg/dL (ref 6–20)
CO2: 23 mmol/L (ref 22–32)
Calcium: 9.1 mg/dL (ref 8.9–10.3)
Chloride: 98 mmol/L (ref 98–111)
Creatinine, Ser: 0.94 mg/dL (ref 0.61–1.24)
GFR, Estimated: >60 mL/min (ref >60)
Glucose, Bld: 100 mg/dL — ABNORMAL HIGH (ref 70–99)
Potassium: 3.5 mmol/L (ref 3.5–5.1)
Sodium: 136 mmol/L (ref 135–145)
Total Bilirubin: 0.8 mg/dL (ref 0.3–1.2)
Total Protein: 7 g/dL (ref 6.5–8.1)

## 2022-06-05 LAB — URINALYSIS, ROUTINE W REFLEX MICROSCOPIC
Bacteria, UA: NONE SEEN
Bilirubin Urine: NEGATIVE
Glucose, UA: NEGATIVE mg/dL
Hgb urine dipstick: NEGATIVE
Ketones, ur: NEGATIVE mg/dL
Leukocytes,Ua: NEGATIVE
Nitrite: NEGATIVE
Protein, ur: NEGATIVE mg/dL
Specific Gravity, Urine: 1.005 (ref 1.005–1.030)
pH: 5 (ref 5.0–8.0)

## 2022-06-05 LAB — LIPASE, BLOOD: Lipase: 33 U/L (ref 11–51)

## 2022-06-05 MED ORDER — KETOROLAC TROMETHAMINE 15 MG/ML IJ SOLN
15.0000 mg | Freq: Once | INTRAMUSCULAR | Status: AC
  Administered 2022-06-05: 15 mg via INTRAVENOUS
  Filled 2022-06-05: qty 1

## 2022-06-05 MED ORDER — ALUM & MAG HYDROXIDE-SIMETH 200-200-20 MG/5ML PO SUSP
30.0000 mL | Freq: Once | ORAL | Status: AC
  Administered 2022-06-05: 30 mL via ORAL
  Filled 2022-06-05: qty 30

## 2022-06-05 MED ORDER — SODIUM CHLORIDE 0.9 % IV BOLUS
1000.0000 mL | Freq: Once | INTRAVENOUS | Status: AC
  Administered 2022-06-05: 1000 mL via INTRAVENOUS

## 2022-06-05 MED ORDER — ONDANSETRON HCL 4 MG/2ML IJ SOLN
4.0000 mg | Freq: Once | INTRAMUSCULAR | Status: AC
  Administered 2022-06-05: 4 mg via INTRAVENOUS
  Filled 2022-06-05: qty 2

## 2022-06-05 MED ORDER — OMEPRAZOLE 20 MG PO CPDR: 20.0000 mg | DELAYED_RELEASE_CAPSULE | Freq: Every day | ORAL | 0 refills | Status: AC

## 2022-06-05 MED ORDER — LIDOCAINE VISCOUS HCL 2 % MT SOLN
15.0000 mL | Freq: Once | OROMUCOSAL | Status: AC
  Administered 2022-06-05: 15 mL via ORAL
  Filled 2022-06-05: qty 15

## 2022-06-05 MED ORDER — ONDANSETRON 4 MG PO TBDP: 4.0000 mg | ORAL_TABLET | Freq: Three times a day (TID) | ORAL | 0 refills | Status: DC | PRN

## 2022-06-05 MED ORDER — IOHEXOL 300 MG/ML  SOLN
80.0000 mL | Freq: Once | INTRAMUSCULAR | Status: AC | PRN
  Administered 2022-06-05: 80 mL via INTRAVENOUS

## 2022-06-05 MED ORDER — ONDANSETRON 4 MG PO TBDP: 4.0000 mg | ORAL_TABLET | Freq: Three times a day (TID) | ORAL | 0 refills | Status: AC | PRN

## 2022-06-05 MED ORDER — OMEPRAZOLE 20 MG PO CPDR: 20.0000 mg | DELAYED_RELEASE_CAPSULE | Freq: Every day | ORAL | 0 refills | Status: DC

## 2022-06-05 NOTE — Discharge Instructions (Signed)
Take omeprazole daily.  Take Zofran as needed as prescribed for nausea and vomiting. Call GI to schedule follow-up.

## 2022-06-05 NOTE — ED Triage Notes (Signed)
Pt states that he has been having RLQ abd pain since Wed with n/v that goes to R flank area.

## 2022-06-05 NOTE — ED Provider Triage Note (Signed)
Emergency Medicine Provider Triage Evaluation Note  Eddie Gonzales , a 36 y.o. male  was evaluated in triage.  Pt complains of right sided abdominal pain onset Friday, worsening yesterday. Pain is constant, worse with breathing and lying flat. No improvement with BC powders. Symptoms preceded by some sporadic vomiting onset on Wednesday. Had small "green" BM today; denies melena, hematochezia. No hx of abdominal surgeries. Does drink 6-12 beers daily. Denies illicit drugs aside from occasional marijuana use..  Review of Systems  Positive: As above Negative: Fever, urinary symptoms, melena, hematochezia  Physical Exam  BP 134/85   Pulse 91   Temp 98.2 F (36.8 C) (Oral)   Resp 16   SpO2 97%  Gen:   Awake, no distress   Resp:  Normal effort  MSK:   Moves extremities without difficulty  Other:  TTP in the epigastrium, RUQ, RLQ. No palpable masses. There is voluntary guarding.  Medical Decision Making  Medically screening exam initiated at 1:25 AM.  Appropriate orders placed.  Eddie Gonzales was informed that the remainder of the evaluation will be completed by another provider, this initial triage assessment does not replace that evaluation, and the importance of remaining in the ED until their evaluation is complete.  Abdominal pain with hx of daily, heavy ETOH use - will proceed with labs, CT.    Antony Madura, PA-C 06/05/22 0129

## 2022-06-05 NOTE — ED Provider Notes (Signed)
[facility] EMERGENCY DEPARTMENT Provider Note   CSN: [id number] Arrival date & time: 06/05/22  0057     History  Chief Complaint  Patient presents with   Abdominal Pain    Eddie Gonzales is a 36 y.o. male.  36 year old male presents with complaint of right-sided abdominal pain.  Patient states he began to feel poorly on Wednesday (4 days ago) with nausea and vomiting.  Patient developed pain on his right side the following day.  States he had a lack of appetite but has tried eating beef stew, cheeseburger and fries and fried chicken which all seem to induce vomiting.  He did try taking Goody powder which he thought helped although remembered after the fact that he has had inflammation of the stomach from Goody's in the past.  He denies any changes in bowel or bladder habits, denies blood in his emesis or stools.  No prior abdominal surgeries.  He does drink 6-12 beers daily and denies illicit drug use although does use marijuana occasionally.  No known sick contacts.  No recent travel.       Home Medications Prior to Admission medications   Medication Sig Start Date End Date Taking? Authorizing Provider  omeprazole (PRILOSEC) 20 MG capsule Take 1 capsule (20 mg total) by mouth daily. 06/05/22  Yes Jeannie Fend, PA-C  ondansetron (ZOFRAN-ODT) 4 MG disintegrating tablet Take 1 tablet (4 mg total) by mouth every 8 (eight) hours as needed for nausea or vomiting. 06/05/22  Yes Jeannie Fend, PA-C      Allergies    Patient has no known allergies.    Review of Systems   Review of Systems Negative except as per HPI Physical Exam Updated Vital Signs BP 112/84   Pulse 82   Temp 98 F (36.7 C)   Resp 16   SpO2 100%  Physical Exam Vitals and nursing note reviewed.  Constitutional:      General: He is not in acute distress.    Appearance: He is well-developed. He is not diaphoretic.  HENT:     Head: Normocephalic and atraumatic.  Cardiovascular:     Rate  and Rhythm: Normal rate and regular rhythm.     Heart sounds: Normal heart sounds.  Pulmonary:     Effort: Pulmonary effort is normal.     Breath sounds: Normal breath sounds.  Abdominal:     Palpations: Abdomen is soft.     Tenderness: There is generalized abdominal tenderness and tenderness in the right upper quadrant, right lower quadrant, epigastric area and left upper quadrant. There is no right CVA tenderness, left CVA tenderness, guarding or rebound. Negative signs include Dinesha Twiggs's sign.  Skin:    General: Skin is warm and dry.     Findings: No erythema or rash.  Neurological:     Mental Status: He is alert and oriented to person, place, and time.  Psychiatric:        Behavior: Behavior normal.     ED Results / Procedures / Treatments   Labs (all labs ordered are listed, but only abnormal results are displayed) Labs Reviewed  CBC WITH DIFFERENTIAL/PLATELET - Abnormal; Notable for the following components:      Result Value   Monocytes Absolute 1.3 (*)    All other components within normal limits  COMPREHENSIVE METABOLIC PANEL - Abnormal; Notable for the following components:   Glucose, Bld 100 (*)    AST 60 (*)    All other components within normal  limits  URINALYSIS, ROUTINE W REFLEX MICROSCOPIC - Abnormal; Notable for the following components:   Color, Urine STRAW (*)    All other components within normal limits  LIPASE, BLOOD    EKG None  Radiology CT ABDOMEN PELVIS W CONTRAST  Result Date: 06/05/2022 CLINICAL DATA:  36 year old male with right lower quadrant abdominal pain for 4 days. Pain radiating to the right flank. EXAM: CT ABDOMEN AND PELVIS WITH CONTRAST TECHNIQUE: Multidetector CT imaging of the abdomen and pelvis was performed using the standard protocol following bolus administration of intravenous contrast. RADIATION DOSE REDUCTION: This exam was performed according to the departmental dose-optimization program which includes automated exposure control,  adjustment of the mA and/or kV according to patient size and/or use of iterative reconstruction technique. CONTRAST:  37mL OMNIPAQUE IOHEXOL 300 MG/ML  SOLN COMPARISON:  CT Chest, Abdomen, and Pelvis 05/25/2011. FINDINGS: Lower chest: Negative. Hepatobiliary: Negative liver and gallbladder. Pancreas: Negative. Spleen: Negative. Adrenals/Urinary Tract: Normal adrenal glands. Kidneys are symmetric and within normal limits. No nephrolithiasis or hydronephrosis. Both ureters appear decompressed. Unremarkable bladder. Chronic right side pelvic phleboliths. Stomach/Bowel: Appendix remains normal (coronal image 29). Right: And transverse colon within normal limits. Questionable mild mesenteric stranding along the descending colon (such as coronal image 33), but no definite colonic wall thickening. Sigmoid and rectum within normal limits. No dilated small bowel. Negative terminal ileum. Fairly decompressed stomach and duodenum. No free air or free fluid. Vascular/Lymphatic: Major arterial structures appear patent and normal. Patent main portal vein. No lymphadenopathy identified. Reproductive: Negative. Other: No pelvic free fluid. Musculoskeletal: Negative. IMPRESSION: Normal appendix. Difficult to exclude a mild Descending colitis (left abdomen), but may be simply artifact there. No other acute or inflammatory process in the abdomen or pelvis. Electronically Signed   By: Odessa Fleming M.D.   On: 06/05/2022 07:12   DG Chest 1 View  Result Date: 06/05/2022 CLINICAL DATA:  Upper abdominal pain.  History of alcohol abuse. EXAM: CHEST  1 VIEW COMPARISON:  05/26/2011 FINDINGS: The cardiomediastinal contours are normal. The lungs are clear. No visible pneumomediastinum. Pulmonary vasculature is normal. No consolidation, pleural effusion, or pneumothorax. No acute osseous abnormalities are seen. No free air in the upper abdomen. IMPRESSION: No acute findings. Electronically Signed   By: Narda Rutherford M.D.   On: 06/05/2022 01:50     Procedures Procedures    Medications Ordered in ED Medications  iohexol (OMNIPAQUE) 300 MG/ML solution 80 mL (80 mLs Intravenous Contrast Given 06/05/22 0700)  sodium chloride 0.9 % bolus 1,000 mL (1,000 mLs Intravenous New Bag/Given 06/05/22 1433)  ondansetron (ZOFRAN) injection 4 mg (4 mg Intravenous Given 06/05/22 1434)  ketorolac (TORADOL) 15 MG/ML injection 15 mg (15 mg Intravenous Given 06/05/22 1434)  alum & mag hydroxide-simeth (MAALOX/MYLANTA) 200-200-20 MG/5ML suspension 30 mL (30 mLs Oral Given 06/05/22 1430)    And  lidocaine (XYLOCAINE) 2 % viscous mouth solution 15 mL (15 mLs Oral Given 06/05/22 1430)    ED Course/ Medical Decision Making/ A&P                           Medical Decision Making Risk OTC drugs. Prescription drug management.   This patient presents to the ED for concern of abdominal pain, this involves an extensive number of treatment options, and is a complaint that carries with it a high risk of complications and morbidity.  The differential diagnosis includes not limited to appendicitis, acute cholecystitis, pancreatitis, diverticulitis colitis, gastritis, peptic ulcer disease  Co morbidities that complicate the patient evaluation  Reports regular alcohol use, does use Goody powder.   Additional history obtained:  External records from outside source obtained and reviewed including prior labs on file from July 2019   Lab Tests:  I Ordered, and personally interpreted labs.  The pertinent results include: CBC with normal white count, normal H&H, remaining exam unremarkable. CMP with mildly elevated AST at 60.  Urinalysis is unremarkable.  Lipase normal.   Imaging Studies ordered:  I ordered imaging studies including CT abdomen pelvis with contrast I independently visualized and interpreted imaging which showed possible colitis, appendix normal I agree with the radiologist interpretation   Problem List / ED Course / Critical interventions /  Medication management  36 year old male presents with complaint of abdominal pain as above.  On exam, he is found of generalized tenderness, worse across the upper abdomen and right lower quadrant.  Patient's work-up in the ER including lab work and CT scan are overall reassuring.  Discussed with him his symptoms may related to colitis versus some gastritis.  Recommend he discontinue use of BC Goody powder.  Given course of omeprazole, Zofran, referral to GI for further work-up. I ordered medication including GI cocktail, Toradol, IV fluids for pain Reevaluation of the patient after these medicines showed that the patient improved I have reviewed the patients home medicines and have made adjustments as needed   Social Determinants of Health:  No PCP   Test / Admission - Considered:  Consider right upper quadrant ultrasound however lab work overall reassuring, negative Lory Galan sign, doubt acute cholecystitis at this time.         Final Clinical Impression(s) / ED Diagnoses Final diagnoses:  Generalized abdominal pain  Colitis    Rx / DC Orders ED Discharge Orders          Ordered    omeprazole (PRILOSEC) 20 MG capsule  Daily        06/05/22 1412    ondansetron (ZOFRAN-ODT) 4 MG disintegrating tablet  Every 8 hours PRN        06/05/22 1412              Jeannie Fend, PA-C 06/05/22 1436    Sloan Leiter, DO 06/05/22 2130

## 2022-07-05 NOTE — Progress Notes (Deleted)
     07/05/2022 BERNIS SCHREUR [id number] 03/28/1986  Referring provider: No ref. provider found Primary GI doctor: {acdocs:27040}  ASSESSMENT AND PLAN:   There are no diagnoses linked to this encounter.   Patient Care Team: Pcp, No as PCP - General  HISTORY OF PRESENT ILLNESS: 36 y.o. male with a past medical history of ***and others listed below, presents for ER follow up on 06/05/22 for abdominal pain and vomiting.  Patient had abdominal pain Patient was also seen in urgent care in May 2022 for nausea and vomiting and again August 16, 2021 for nausea and vomiting. Labs reviewed from the ER show no leukocytosis, no anemia, normal electrolytes, normal kidney, AST 60, ALT 26, alk phos 64, normal lipase CT abdomen pelvis with contrast showed normal appendix, difficult to exclude a mild descending colitis but may be simple artifact there no other acute or inflammatory process. Patient smokes cigarettes, reports 6-12 beers daily, denies illicit drug use though does admit to occasional marijuana. Uses Goody powders.  Current Medications:        Current Outpatient Medications (Other):    omeprazole (PRILOSEC) 20 MG capsule, Take 1 capsule (20 mg total) by mouth daily.   ondansetron (ZOFRAN-ODT) 4 MG disintegrating tablet, Take 1 tablet (4 mg total) by mouth every 8 (eight) hours as needed for nausea or vomiting.  Medical History: No past medical history on file. Allergies: No Known Allergies   Surgical History:  He  has no past surgical history on file. Family History:  His family history is not on file.  REVIEW OF SYSTEMS  : All other systems reviewed and negative except where noted in the History of Present Illness.  PHYSICAL EXAM: There were no vitals taken for this visit. General:   Pleasant, well developed male in no acute distress Head:   Normocephalic and atraumatic. Eyes:  sclerae anicteric,conjunctive pink  Heart:   {HEART EXAM HEM/ONC:21750} Pulm:  Clear  anteriorly; no wheezing Abdomen:   {BlankSingle:19197::"Distended","Ridged","Soft"}, {BlankSingle:19197::"Flat","Obese","Non-distended"} AB, {BlankSingle:19197::"Absent","Hyperactive, tinkling","Hypoactive","Sluggish","Active"} bowel sounds. {actendernessAB:27319} tenderness {anatomy; site abdomen:5010}. {BlankMultiple:19196::"Without guarding","With guarding","Without rebound","With rebound"}, No organomegaly appreciated. Rectal: {acrectalexam:27461} Extremities:  {With/Without:304960234} edema. Msk: Symmetrical without gross deformities. Peripheral pulses intact.  Neurologic:  Alert and  oriented x4;  No focal deficits.  Skin:   Dry and intact without significant lesions or rashes. Psychiatric:  Cooperative. Normal mood and affect.    Vladimir Crofts, PA-C 8:22 AM

## 2022-07-06 ENCOUNTER — Ambulatory Visit: Payer: No Typology Code available for payment source | Admitting: Physician Assistant

## 2022-12-15 IMAGING — DX DG HAND COMPLETE 3+V*R*
3 series · 3 of 3 positions shown · non-contrast
Comparison: Wrist radiographs 02/18/2009

CLINICAL DATA: Pain and swelling after punching a wall

EXAM:
RIGHT HAND - COMPLETE 3+ VIEW

[x hand pa right]
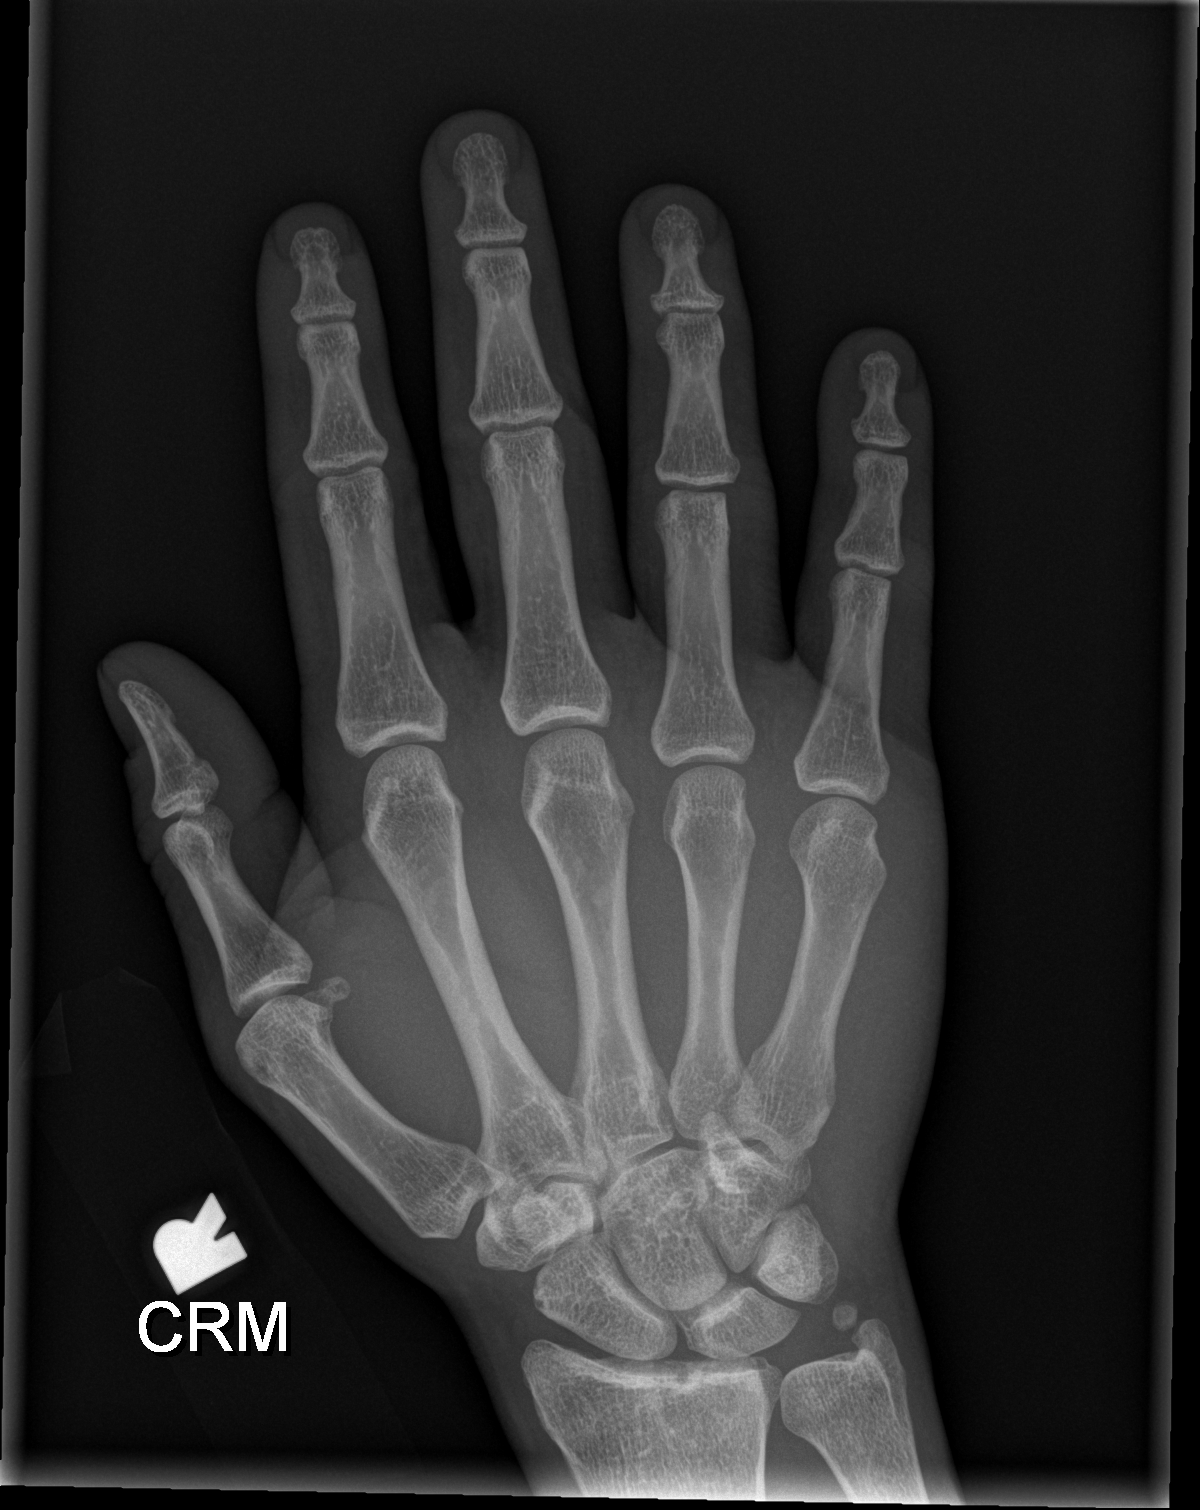

[x hand obl right]
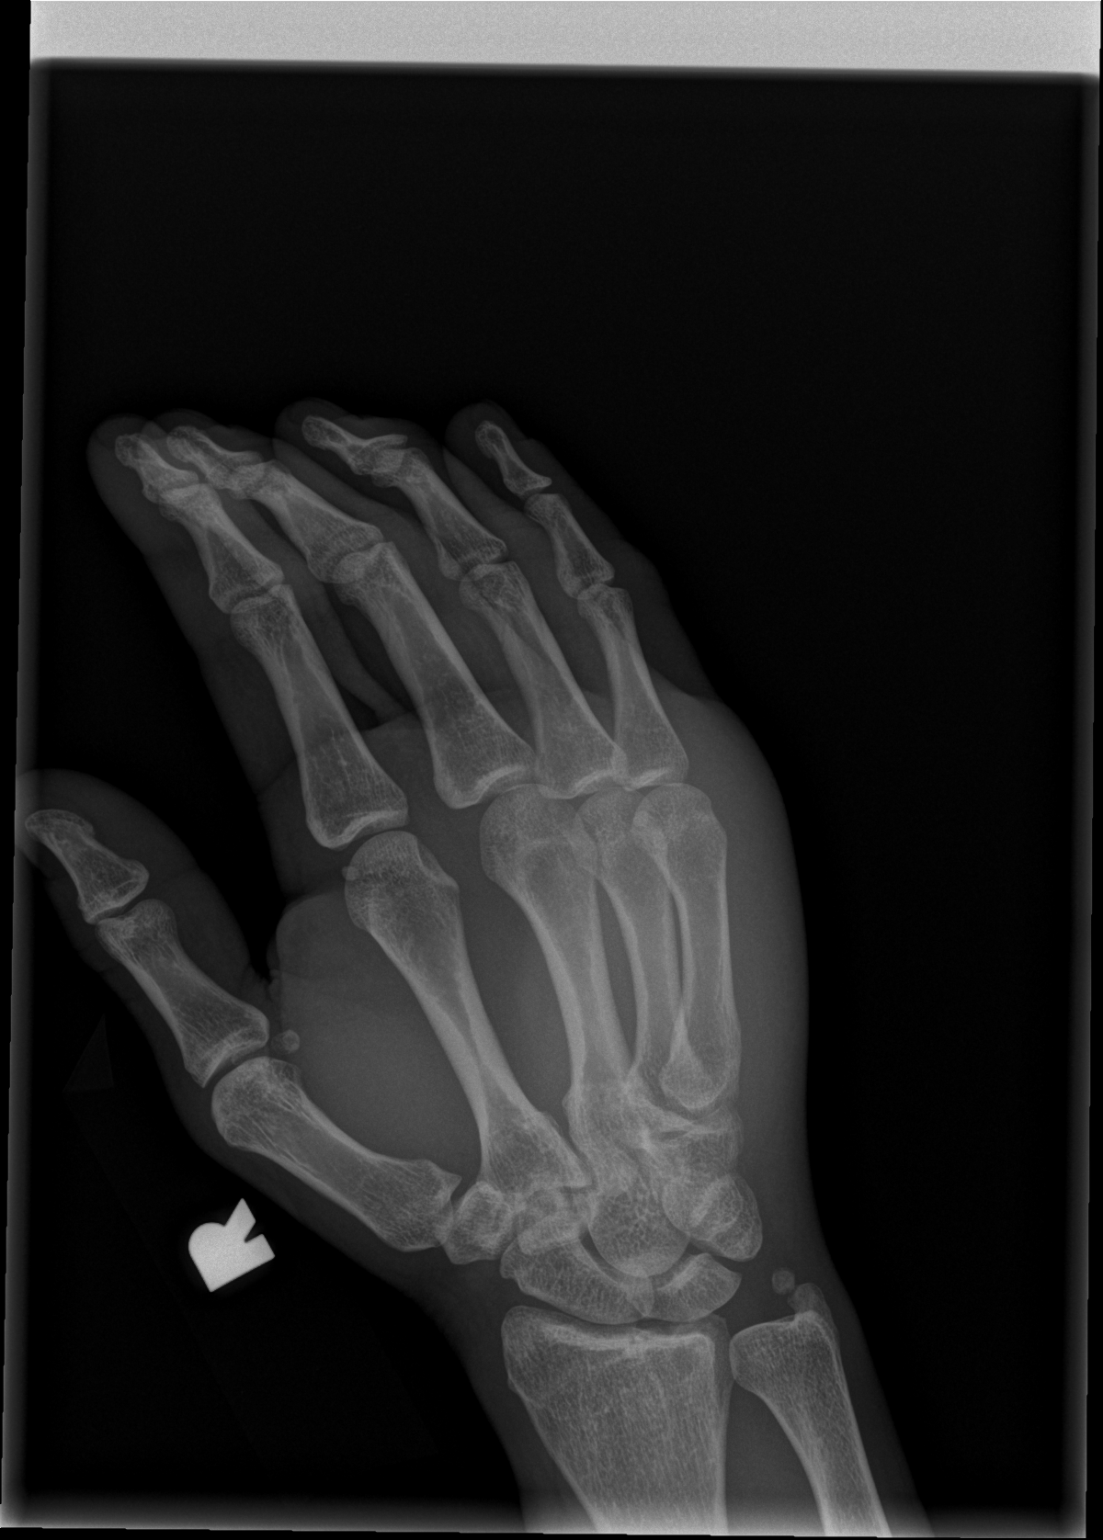

[x hand lat right]
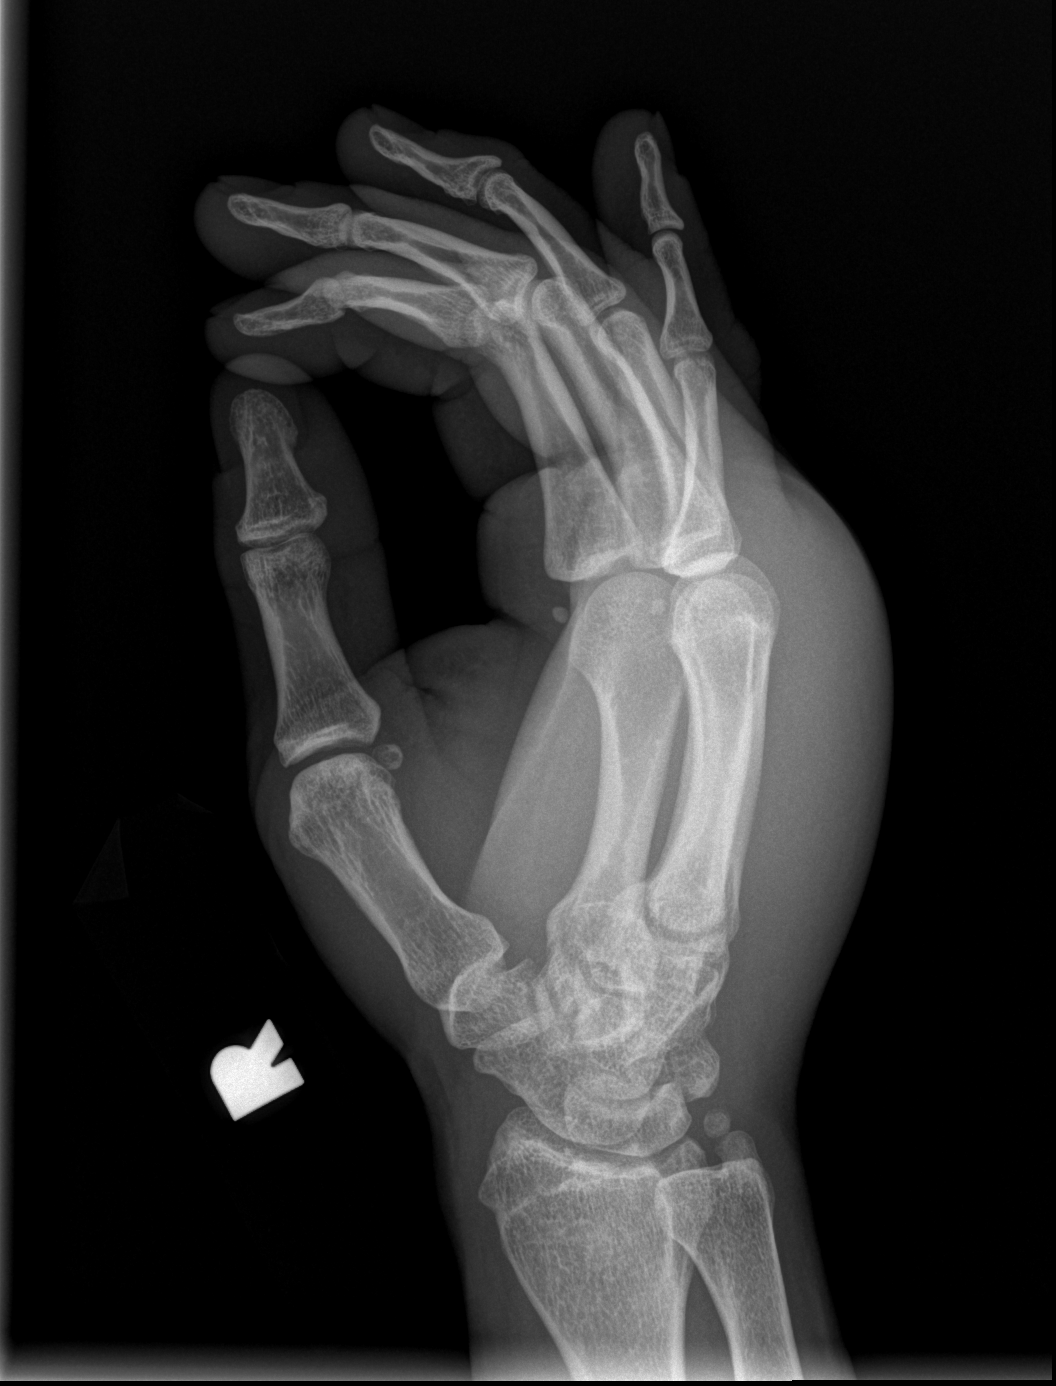

[3 of 3 positions shown; findings below may reference images not displayed]

FINDINGS: There is no acute fracture or dislocation. There is a remote
appearing ulnar styloid fracture. Alignment is normal. The joint
spaces are preserved. There is no erosive change. There is soft
tissue swelling over the dorsum of the hand.
IMPRESSION: 1. No acute fracture or dislocation.
2. Soft tissue swelling over the dorsum of the hand.
